# Patient Record
Sex: Female | Born: 1960 | Race: White | Hispanic: No | Marital: Single | Smoking: Current every day smoker
Health system: Southern US, Community
[De-identification: ages and names within clinical notes are randomized; demographics above are authoritative.]

## PROBLEM LIST (undated history)

## (undated) DIAGNOSIS — K219 Gastro-esophageal reflux disease without esophagitis: Secondary | ICD-10-CM

## (undated) DIAGNOSIS — E7800 Pure hypercholesterolemia, unspecified: Secondary | ICD-10-CM

## (undated) DIAGNOSIS — G629 Polyneuropathy, unspecified: Secondary | ICD-10-CM

## (undated) HISTORY — PX: NO PAST SURGERIES: SHX2092

## (undated) HISTORY — DX: Polyneuropathy, unspecified: G62.9

## (undated) HISTORY — DX: Gastro-esophageal reflux disease without esophagitis: K21.9

## (undated) HISTORY — DX: Pure hypercholesterolemia, unspecified: E78.00

## (undated) HISTORY — PX: BREAST BIOPSY: SHX20

---

## 1998-10-13 ENCOUNTER — Emergency Department (HOSPITAL_COMMUNITY): Admission: EM | Admit: 1998-10-13 | Discharge: 1998-10-13 | Payer: Self-pay | Admitting: Emergency Medicine

## 2002-03-02 ENCOUNTER — Encounter: Payer: Self-pay | Admitting: Family Medicine

## 2002-03-02 ENCOUNTER — Ambulatory Visit (HOSPITAL_COMMUNITY): Admission: RE | Admit: 2002-03-02 | Discharge: 2002-03-02 | Payer: Self-pay | Admitting: Family Medicine

## 2002-07-06 ENCOUNTER — Encounter: Admission: RE | Admit: 2002-07-06 | Discharge: 2002-07-06 | Payer: Self-pay | Admitting: Family Medicine

## 2002-07-06 ENCOUNTER — Encounter: Payer: Self-pay | Admitting: Family Medicine

## 2005-11-30 ENCOUNTER — Emergency Department (HOSPITAL_COMMUNITY): Admission: EM | Admit: 2005-11-30 | Discharge: 2005-11-30 | Payer: Self-pay | Admitting: Emergency Medicine

## 2006-01-18 ENCOUNTER — Other Ambulatory Visit: Admission: RE | Admit: 2006-01-18 | Discharge: 2006-01-18 | Payer: Self-pay | Admitting: Family Medicine

## 2007-05-27 ENCOUNTER — Ambulatory Visit (HOSPITAL_COMMUNITY): Admission: RE | Admit: 2007-05-27 | Discharge: 2007-05-27 | Payer: Self-pay | Admitting: *Deleted

## 2007-05-27 ENCOUNTER — Encounter (INDEPENDENT_AMBULATORY_CARE_PROVIDER_SITE_OTHER): Payer: Self-pay | Admitting: *Deleted

## 2009-07-08 ENCOUNTER — Other Ambulatory Visit: Admission: RE | Admit: 2009-07-08 | Discharge: 2009-07-08 | Payer: Self-pay | Admitting: Family Medicine

## 2009-07-10 ENCOUNTER — Encounter: Admission: RE | Admit: 2009-07-10 | Discharge: 2009-07-10 | Payer: Self-pay | Admitting: Family Medicine

## 2009-08-02 ENCOUNTER — Encounter: Admission: RE | Admit: 2009-08-02 | Discharge: 2009-08-02 | Payer: Self-pay | Admitting: Family Medicine

## 2009-08-08 ENCOUNTER — Encounter: Admission: RE | Admit: 2009-08-08 | Discharge: 2009-08-08 | Payer: Self-pay | Admitting: Family Medicine

## 2010-05-27 NOTE — Op Note (Signed)
NAMEPENNYE, BEEGHLY NO.:  [id number]   MEDICAL RECORD NO.:  [id number]          PATIENT TYPE:  AMB   LOCATION:  ENDO                         FACILITY:  [facility]   PHYSICIAN:  Georgiana Spinner, M.D.    DATE OF BIRTH:  [birth year]   DATE OF PROCEDURE:  DATE OF DISCHARGE:                               OPERATIVE REPORT   PROCEDURE:  Colonoscopy.   INDICATIONS:  Colonoscopy because of rectal bleeding.   ANESTHESIA:  Demerol 50, Versed 5 mg.   PROCEDURE:  With the patient mildly sedated in the left lateral  decubitus position, the Pentax videoscopic colonoscope was inserted in  the rectum and passed under direct vision to the cecum, identified by  base of cecum and ileocecal valve, both of which were photographed.  From this point, the colonoscope was slowly withdrawn taking  circumferential views of colonic mucosa, stopping only in the rectum  which appeared normal and direct showed hemorrhoids on retroflexed view.  The endoscope was straightened and withdrawn.  The patient's vital signs  and pulse oximeter remained stable.  The patient tolerated procedure  well without apparent complication.   FINDINGS:  Internal hemorrhoids, otherwise an unremarkable colonoscopic  examination.   PLAN:  See endoscopy note for further details.           ______________________________  Georgiana Spinner, M.D.     GMO/MEDQ  D:  05/27/2007  T:  05/27/2007  Job:  [id number]

## 2010-05-27 NOTE — Op Note (Signed)
Marisa Cardenas, Marisa Cardenas NO.:  [id number]   MEDICAL RECORD NO.:  [id number]          PATIENT TYPE:  AMB   LOCATION:  ENDO                         FACILITY:  [facility]   PHYSICIAN:  Georgiana Spinner, M.D.    DATE OF BIRTH:  [birth year]   DATE OF PROCEDURE:  DATE OF DISCHARGE:                               OPERATIVE REPORT   PROCEDURE:  Upper endoscopy.   INDICATIONS:  Hemoccult positivity.   ANESTHESIA:  Demerol 70 mg, Versed 7.5 mg.   PROCEDURE:  With the patient mildly sedated in the left lateral  decubitus position, the Pentax videoscopic endoscope was inserted in the  mouth, passed under direct vision through the esophagus.  We found  changes of esophagitis, photographed, entered into the stomach and there  were blood flecks seen in the stomach.  This was photographed.  As we  advanced through the antrum, duodenal bulb was inflamed.  Photographs  and biopsies taken.  Second portion duodenum appeared normal.  From this  point, the endoscope was slowly withdrawn taking circumferential views  of duodenal mucosa until the endoscope been pulled back into the  stomach, placed in retroflexion to view the stomach from below.  The  endoscope was straightened and withdrawn taking circumferential views of  the remaining gastric and esophageal mucosa.  On pullback I could not  get a good view of the areas of esophagitis so I did not biopsied them.  The endoscope was withdrawn.  The patient's vital signs and pulse  oximeter remained stable.  The patient tolerated procedure well without  apparent complication.   FINDINGS:  Blood in the stomach from esophagitis and/or duodenitis, the  latter biopsied.  An incomplete wrap of the gastroesophageal junction  around the endoscope indicating laxity of the lower esophageal  sphincter.   PLAN:  Await biopsy report.  The patient should be on a PPI or some  therapy directed toward acidity and will have the patient call me for  results of  biopsy and follow up with me as an outpatient.           ______________________________  Georgiana Spinner, M.D.     GMO/MEDQ  D:  05/27/2007  T:  05/27/2007  Job:  [id number]

## 2010-11-17 ENCOUNTER — Other Ambulatory Visit (HOSPITAL_COMMUNITY)
Admission: RE | Admit: 2010-11-17 | Discharge: 2010-11-17 | Disposition: A | Discharge status: Home / Self Care | Payer: BC Managed Care – PPO | Source: Ambulatory Visit | Attending: Family Medicine | Admitting: Family Medicine

## 2010-11-17 ENCOUNTER — Other Ambulatory Visit: Payer: Self-pay | Admitting: Family Medicine

## 2010-11-17 DIAGNOSIS — Z124 Encounter for screening for malignant neoplasm of cervix: Secondary | ICD-10-CM | POA: Insufficient documentation

## 2010-11-17 DIAGNOSIS — Z1159 Encounter for screening for other viral diseases: Secondary | ICD-10-CM | POA: Insufficient documentation

## 2013-07-11 ENCOUNTER — Other Ambulatory Visit: Payer: Self-pay | Admitting: Family Medicine

## 2013-07-11 ENCOUNTER — Ambulatory Visit
Admission: RE | Admit: 2013-07-11 | Discharge: 2013-07-11 | Disposition: A | Discharge status: Home / Self Care | Payer: BC Managed Care – PPO | Source: Ambulatory Visit | Attending: Family Medicine | Admitting: Family Medicine

## 2013-07-11 DIAGNOSIS — G629 Polyneuropathy, unspecified: Secondary | ICD-10-CM

## 2013-08-21 ENCOUNTER — Encounter: Payer: Self-pay | Admitting: Neurology

## 2013-08-21 ENCOUNTER — Ambulatory Visit (INDEPENDENT_AMBULATORY_CARE_PROVIDER_SITE_OTHER): Payer: BC Managed Care – PPO | Admitting: Neurology

## 2013-08-21 VITALS — BP 140/88 | HR 80 | Resp 16 | Ht 64.0 in | Wt 141.0 lb

## 2013-08-21 DIAGNOSIS — R209 Unspecified disturbances of skin sensation: Secondary | ICD-10-CM

## 2013-08-21 DIAGNOSIS — G5600 Carpal tunnel syndrome, unspecified upper limb: Secondary | ICD-10-CM

## 2013-08-21 DIAGNOSIS — G5602 Carpal tunnel syndrome, left upper limb: Secondary | ICD-10-CM

## 2013-08-21 NOTE — Progress Notes (Signed)
[facility] Clinic Note - Initial Visit   Date: 08/21/2013  Marisa Cardenas MRN: [id number] DOB: 1960-03-03   Dear Dr. Radene Ou:  Thank you for your kind referral of Marisa Cardenas for consultation of bilateral hand paresthesias. Although her history is well known to you, please allow [place] to reiterate it for the purpose of our medical record. The patient was accompanied to the clinic by self.    History of Present Illness: Marisa Cardenas is a 53 y.o. right-handed Caucasian female with history of GERD, hyperlipidemia, and current tobacco use presenting for evaluation of bilateral hand numbness/tingling.    Starting in ~2010, she starting having intermittent spells of numbness of the right hand, especially the 3th and 4th digit.  Symptoms started worsening in the summer of 2015 because numbness > tingling became constant.  It worse in the morning and finds herself trying to stretch her arm at night because it wakes her up from sleeping. She does report to sleeping with her wrist in a flexed position. Two weeks, ago she similar symptoms started in the left hand, but involves her entire hand.  She also has associated left arm numbness.  It stops after rubbing her hands or soaking them in hot water.   She also complains of generalized joint pain (elbows, neck) and was started on nabumetone 549m which helps, but makes her too sleepy.  She gets too sleep on amitriptyline 245m so is not taking it.  She reports having mild weakness of the arms and rarely drops things.  She feels that after taking atrovastatin in June 2015, she feels that her numbness and weakness worsened.  She endorses muscle cramps.  No myalgias or tea-colored urine.  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  Out-side paper records, electronic medical record, and images have been reviewed where available and summarized as: Labs 06/27/2013:  ESR 6, TSH  1.77, vitamin B12 398, folate 9.2, RF 9.4 XR cervical spine 07/11/2013:  There is mild degenerative disc and facet joint change of the cervical spine. There is no acute bony abnormality.   Past Medical History  Diagnosis Date  . Hypercholesteremia   . Neuropathy   . GERD (gastroesophageal reflux disease)     Past Surgical History  Procedure Laterality Date  . No past surgeries       Medications:  Lipitor 2081maily Prilosec 69m31mily Amitriptyline 25mg88mly Nabutomone 500mg 51my   Allergies:  Allergies  Allergen Reactions  . Penicillins Rash    Family History: Family History  Problem Relation Age of Onset  . COPD Mother     Living  . Lung cancer Father     Deceased  . Hypertension Brother   . Hyperlipidemia Brother   . Healthy Sister     Social History: History   Social History  . Marital Status: Single    Spouse Name: N/A    Number of Children: N/A  . Years of Education: N/A   Occupational History  . Not on file.   Social History Main Topics  . Smoking status: Current Every Day Smoker -- 1.50 packs/day for 20 years    Types: Cigarettes  . Smokeless tobacco: Not on file  . Alcohol Use: No  . Drug Use: No  . Sexual Activity: Not on file   Other Topics Concern  . Not on file   Social History Narrative   She lives with mother as her caregiver.  She has one grown child.   She works as an Transport planner.   Highest level of education:  High school    Review of Systems:  CONSTITUTIONAL: No fevers, chills, night sweats, or weight loss.   EYES: No visual changes or eye pain ENT: No hearing changes.  No history of nose bleeds.   RESPIRATORY: No cough, wheezing and shortness of breath.   CARDIOVASCULAR: Negative for chest pain, and palpitations.   GI: Negative for abdominal discomfort, blood in stools or black stools.  No recent change in bowel habits.   GU:  No history of incontinence.   MUSCLOSKELETAL: No history of joint pain or swelling.  No  myalgias.   SKIN: Negative for lesions, rash, and itching.   HEMATOLOGY/ONCOLOGY: Negative for prolonged bleeding, bruising easily, and swollen nodes.   ENDOCRINE: Negative for cold or heat intolerance, polydipsia or goiter.   PSYCH:  +depression or anxiety symptoms.   NEURO: As Above.   Vital Signs:  BP 140/88  Pulse 80  Resp 16  Ht 5' 4"  (1.626 m)  Wt 141 lb (63.957 kg)  BMI 24.19 kg/m2 Pain Scale: 4 on a scale of 0-10   General Medical Exam:   General:  Well appearing, comfortable.   Eyes/ENT: see cranial nerve examination.   Neck: No masses appreciated.  Full range of motion without tenderness.  No carotid bruits. Respiratory:  Clear to auscultation, good air entry bilaterally.   Cardiac:  Regular rate and rhythm, no murmur.   Extremities:  No deformities, edema, or skin discoloration. Good capillary refill.   Skin:  Skin color, texture, turgor normal. No rashes or lesions.  Neurological Exam: MENTAL STATUS including orientation to time, place, person, recent and remote memory, attention span and concentration, language, and fund of knowledge is normal.  Speech is not dysarthric.  CRANIAL NERVES: II:  No visual field defects.  Unremarkable fundi.   III-IV-VI: Pupils equal round and reactive to light.  Normal conjugate, extra-ocular eye movements in all directions of gaze.  No nystagmus.  No ptosis.   V:  Normal facial sensation.     VII:  Normal facial symmetry and movements.    VIII:  Normal hearing and vestibular function.   IX-X:  Normal palatal movement.   XI:  Normal shoulder shrug and head rotation.   XII:  Normal tongue strength and range of motion, no deviation or fasciculation.  MOTOR:  No atrophy, fasciculations or abnormal movements.  No pronator drift.  Tone is normal.    Right Upper Extremity:    Left Upper Extremity:    Deltoid  5/5   Deltoid  5/5   Biceps  5/5   Biceps  5/5   Triceps  5/5   Triceps  5/5   Wrist extensors  5/5   Wrist extensors  5/5     Wrist flexors  5/5   Wrist flexors  5/5   Finger extensors  5/5   Finger extensors  5/5   Finger flexors  5/5   Finger flexors  5/5   Dorsal interossei  5/5   Dorsal interossei  4+/5   Abductor pollicis  5/5   Abductor pollicis  5-/5   Tone (Ashworth scale)  0  Tone (Ashworth scale)  0   Right Lower Extremity:    Left Lower Extremity:    Hip flexors  5/5   Hip flexors  5/5   Hip extensors  5/5   Hip extensors  5/5   Knee flexors  5/5   Knee flexors  5/5   Knee extensors  5/5   Knee extensors  5/5   Dorsiflexors  5/5   Dorsiflexors  5/5   Plantarflexors  5/5   Plantarflexors  5/5   Toe extensors  5/5   Toe extensors  5/5   Toe flexors  5/5   Toe flexors  5/5   Tone (Ashworth scale)  0  Tone (Ashworth scale)  0   MSRs:  Right                                                                 Left brachioradialis 3+  brachioradialis 3+  biceps 3+  biceps 3+  triceps 3+  triceps 3+  patellar 3+  patellar 3+  ankle jerk 2+  ankle jerk 2+  Hoffman no  Hoffman no  plantar response down  plantar response down   SENSORY:  Normal and symmetric perception of light touch, pinprick, vibration, and proprioception.  Romberg's sign absent.   COORDINATION/GAIT: Normal finger-to- nose-finger and heel-to-shin.  Intact rapid alternating movements bilaterally.  Able to rise from a chair without using arms.  Gait narrow based and stable. Tandem and stressed gait intact.   Other:  Tinel's sign positive over the wrist and medial elbow on the left   IMPRESSION/PLAN: Marisa Cardenas is a 53 year-old female presenting for evaluation of bilateral hand paresthesias. Her neurological examination shows mild weakness of the intrinsic hand muscles on the left side, with positive Tinel's over the median and ulnar nerves on the same side. She has relatively brisk reflexes throughout, which in the absence of other associated upper motor neuron findings may be in normal for patient, however cannot exclude possible  cervical canal stenosis. Considerations include left carpal tunnel syndrome, left ulnar neuropathy, and/or right C7 cervical radiculopathy Symptoms are not consistent with statin induced myopathy/myalgias.   To better characterize the nature of her symptoms, I would like to obtain an EMG of bilateral upper extremities.  Additionally, due to high clinical suspicion for carpal tunnel syndrome on the left side, I have asked her to start using a wrist splint.   Discussed adding gabapentin for paresthesias, but she has predominantly numbness with minimal tingling, in which case it would not be helpful.  Return to clinic in 73-month.   The duration of this appointment visit was 45 minutes of face-to-face time with the patient.  Greater than 50% of this time was spent in counseling, explanation of diagnosis, planning of further management, and coordination of care.   Thank you for allowing me to participate in patient's care.  If I can answer any additional questions, I would be pleased to do so.    Sincerely,    Donika K. PPosey Pronto DO

## 2013-08-21 NOTE — Patient Instructions (Signed)
1.  Start using wrist splint on the left hand at bedtime 2.  NCS/EMG of the upper extremities 3.  Return to clinic in 2 months

## 2013-10-23 ENCOUNTER — Ambulatory Visit: Payer: Self-pay

## 2013-10-23 ENCOUNTER — Other Ambulatory Visit: Payer: Self-pay | Admitting: Occupational Medicine

## 2013-10-23 DIAGNOSIS — M25531 Pain in right wrist: Secondary | ICD-10-CM

## 2013-11-01 ENCOUNTER — Ambulatory Visit (INDEPENDENT_AMBULATORY_CARE_PROVIDER_SITE_OTHER): Payer: BC Managed Care – PPO | Admitting: Neurology

## 2013-11-01 DIAGNOSIS — R209 Unspecified disturbances of skin sensation: Secondary | ICD-10-CM

## 2013-11-01 DIAGNOSIS — G5602 Carpal tunnel syndrome, left upper limb: Secondary | ICD-10-CM

## 2013-11-01 NOTE — Procedures (Signed)
[facility]  [address], [address]  [place], [place] [address] Tel: [phone] Fax:  [phone] Test Date:  11/01/2013  Patient: Marisa LowensteinDeborah Capraro DOB: 1960/02/10 Physician: Nita Sickleonika Kalev Temme  Sex: Female Height: 5\' 4"  Ref Phys: Nita Sickleatel, Eugenie Harewood  ID#: [id number] Temp: 37.0C Technician: Ala BentSusan Reid   Patient Complaints: Patient is a 53 year old female presenting for calculation of bilateral hand paresthesias.   NCV & EMG Findings: Extensive electrodiagnostic testing of the left upper extremity and additional studies of the right reveals:  1. Bilateral median sensory responses are prolonged and amplitude is reduced. Bilateral ulnar and radial sensory responses are within normal limits. 2. Bilateral median motor responses are prolonged with preserved amplitude. Bilateral ulnar motor responses are normal. 3. There is no evidence of active or chronic motor axon loss changes affecting any of the tested muscles. Motor unit configuration and recruitment pattern is normal.  Impression: 1. Bilateral median neuropathy at or distal to the wrist, consistent with the clinical diagnosis of carpal tunnel syndrome. Overall, these findings are moderate-to-severe in degree electrically. 2. There is no evidence of a cervical radiculopathy affecting upper extremities.   ___________________________ Nita Sickleonika Anadia Helmes    Nerve Conduction Studies Anti Sensory Summary Table   Stim Site NR Peak (ms) Norm Peak (ms) P-T Amp (V) Norm P-T Amp  Left Median Anti Sensory (2nd Digit)  37C  Wrist    4.9 <3.6 11.3 >15  Right Median Anti Sensory (2nd Digit)  34C  Wrist    5.3 <3.6 9.7 >15  Left Radial Anti Sensory (Base 1st Digit)  34C  Wrist    2.2 <2.7 25.5 >14  Right Radial Anti Sensory (Base 1st Digit)  34C  Wrist    2.1 <2.7 22.8 >14  Left Ulnar Anti Sensory (5th Digit)  37C  Wrist    2.5 <3.1 17.0 >10  Right Ulnar Anti Sensory (5th Digit)  34C  Wrist    2.5 <3.1 21.5 >10   Motor Summary  Table   Stim Site NR Onset (ms) Norm Onset (ms) O-P Amp (mV) Norm O-P Amp Site1 Site2 Delta-0 (ms) Dist (cm) Vel (m/s) Norm Vel (m/s)  Left Median Motor (Abd Poll Brev)  37C  Wrist    4.2 <4.0 10.2 >6 Elbow Wrist 4.7 26.0 55 >50  Elbow    8.9  9.9         Right Median Motor (Abd Poll Brev)  34C  Wrist    4.9 <4.0 9.0 >6 Elbow Wrist 4.8 26.0 54 >50  Elbow    9.7  8.5         Left Ulnar Motor (Abd Dig Minimi)  34C  Wrist    1.9 <3.1 8.0 >7 B Elbow Wrist 3.3 20.0 61 >50  B Elbow    5.2  7.8  A Elbow B Elbow 1.8 10.0 56 >50  A Elbow    7.0  7.6         Right Ulnar Motor (Abd Dig Minimi)  34C  Wrist    2.0 <3.1 7.2 >7 B Elbow Wrist 3.3 20.0 61 >50  B Elbow    5.3  7.1  A Elbow B Elbow 1.7 10.0 59 >50  A Elbow    7.0  7.0          F Wave Studies   NR F-Lat (ms) Lat Norm (ms) L-R F-Lat (ms)  Left Ulnar (Mrkrs) (Abd Dig Min)  37C     25.82 <33  EMG   Side Muscle Ins Act Fibs Psw Fasc Number Recrt Dur Dur. Amp Amp. Poly Poly. Comment  Right 1stDorInt Nml Nml Nml Nml Nml Nml Nml Nml Nml Nml Nml Nml N/A  Right Abd Poll Brev Nml Nml Nml Nml Nml Nml Nml Nml Nml Nml Nml Nml N/A  Right FlexPolLong Nml Nml Nml Nml Nml Nml Nml Nml Nml Nml Nml Nml N/A  Right PronatorTeres Nml Nml Nml Nml Nml Nml Nml Nml Nml Nml Nml Nml N/A  Right Biceps Nml Nml Nml Nml Nml Nml Nml Nml Nml Nml Nml Nml N/A  Right Triceps Nml Nml Nml Nml Nml Nml Nml Nml Nml Nml Nml Nml N/A  Right Deltoid Nml Nml Nml Nml Nml Nml Nml Nml Nml Nml Nml Nml N/A  Left 1stDorInt Nml Nml Nml Nml Nml Nml Nml Nml Nml Nml Nml Nml N/A  Left Abd Poll Brev Nml Nml Nml Nml Nml Nml Nml Nml Nml Nml Nml Nml N/A  Left FlexPolLong Nml Nml Nml Nml Nml Nml Nml Nml Nml Nml Nml Nml N/A  Left PronatorTeres Nml Nml Nml Nml Nml Nml Nml Nml Nml Nml Nml Nml N/A  Left Triceps Nml Nml Nml Nml Nml Nml Nml Nml Nml Nml Nml Nml N/A      Waveforms:

## 2013-12-25 ENCOUNTER — Ambulatory Visit: Payer: BC Managed Care – PPO | Admitting: Physician Assistant

## 2013-12-25 ENCOUNTER — Other Ambulatory Visit: Payer: Self-pay | Admitting: Orthopedic Surgery

## 2013-12-26 ENCOUNTER — Ambulatory Visit: Payer: BC Managed Care – PPO | Admitting: Physician Assistant

## 2014-01-01 ENCOUNTER — Encounter (HOSPITAL_BASED_OUTPATIENT_CLINIC_OR_DEPARTMENT_OTHER): Admission: RE | Payer: Self-pay | Source: Ambulatory Visit

## 2014-01-01 ENCOUNTER — Ambulatory Visit (HOSPITAL_BASED_OUTPATIENT_CLINIC_OR_DEPARTMENT_OTHER)
Admission: RE | Admit: 2014-01-01 | Payer: BC Managed Care – PPO | Source: Ambulatory Visit | Admitting: Orthopedic Surgery

## 2014-01-01 SURGERY — RELEASE, CARPAL TUNNEL, ENDOSCOPIC
Anesthesia: Monitor Anesthesia Care | Laterality: Right

## 2014-01-22 ENCOUNTER — Ambulatory Visit (HOSPITAL_BASED_OUTPATIENT_CLINIC_OR_DEPARTMENT_OTHER)
Admission: RE | Admit: 2014-01-22 | Payer: BC Managed Care – PPO | Source: Ambulatory Visit | Admitting: Orthopedic Surgery

## 2014-01-22 ENCOUNTER — Encounter (HOSPITAL_BASED_OUTPATIENT_CLINIC_OR_DEPARTMENT_OTHER): Admission: RE | Payer: Self-pay | Source: Ambulatory Visit

## 2014-01-22 SURGERY — RELEASE, CARPAL TUNNEL, ENDOSCOPIC
Anesthesia: Monitor Anesthesia Care | Site: Wrist | Laterality: Right

## 2014-07-25 ENCOUNTER — Encounter (HOSPITAL_COMMUNITY): Payer: Self-pay | Admitting: Emergency Medicine

## 2014-07-25 ENCOUNTER — Emergency Department (HOSPITAL_COMMUNITY): Payer: BLUE CROSS/BLUE SHIELD

## 2014-07-25 ENCOUNTER — Emergency Department (HOSPITAL_COMMUNITY)
Admission: EM | Admit: 2014-07-25 | Discharge: 2014-07-25 | Disposition: A | Discharge status: Home / Self Care | Payer: BLUE CROSS/BLUE SHIELD | Attending: Emergency Medicine | Admitting: Emergency Medicine

## 2014-07-25 DIAGNOSIS — E782 Mixed hyperlipidemia: Secondary | ICD-10-CM | POA: Diagnosis not present

## 2014-07-25 DIAGNOSIS — M25512 Pain in left shoulder: Secondary | ICD-10-CM

## 2014-07-25 DIAGNOSIS — S79912A Unspecified injury of left hip, initial encounter: Secondary | ICD-10-CM | POA: Insufficient documentation

## 2014-07-25 DIAGNOSIS — W1782XA Fall from (out of) grocery cart, initial encounter: Secondary | ICD-10-CM | POA: Insufficient documentation

## 2014-07-25 DIAGNOSIS — Z8719 Personal history of other diseases of the digestive system: Secondary | ICD-10-CM | POA: Insufficient documentation

## 2014-07-25 DIAGNOSIS — Z880 Allergy status to penicillin: Secondary | ICD-10-CM | POA: Diagnosis not present

## 2014-07-25 DIAGNOSIS — M545 Low back pain, unspecified: Secondary | ICD-10-CM

## 2014-07-25 DIAGNOSIS — S3992XA Unspecified injury of lower back, initial encounter: Secondary | ICD-10-CM | POA: Diagnosis not present

## 2014-07-25 DIAGNOSIS — Z8669 Personal history of other diseases of the nervous system and sense organs: Secondary | ICD-10-CM | POA: Diagnosis not present

## 2014-07-25 DIAGNOSIS — Z720 Tobacco use: Secondary | ICD-10-CM | POA: Diagnosis not present

## 2014-07-25 DIAGNOSIS — S4992XA Unspecified injury of left shoulder and upper arm, initial encounter: Secondary | ICD-10-CM | POA: Insufficient documentation

## 2014-07-25 DIAGNOSIS — Z79899 Other long term (current) drug therapy: Secondary | ICD-10-CM | POA: Insufficient documentation

## 2014-07-25 DIAGNOSIS — Y9389 Activity, other specified: Secondary | ICD-10-CM | POA: Insufficient documentation

## 2014-07-25 DIAGNOSIS — Y92512 Supermarket, store or market as the place of occurrence of the external cause: Secondary | ICD-10-CM | POA: Insufficient documentation

## 2014-07-25 DIAGNOSIS — M25552 Pain in left hip: Secondary | ICD-10-CM

## 2014-07-25 DIAGNOSIS — Y998 Other external cause status: Secondary | ICD-10-CM | POA: Insufficient documentation

## 2014-07-25 MED ORDER — HYDROCODONE-ACETAMINOPHEN 5-325 MG PO TABS
2.0000 | ORAL_TABLET | Freq: Once | ORAL | Status: AC
  Administered 2014-07-25: 2 via ORAL
  Filled 2014-07-25: qty 2

## 2014-07-25 MED ORDER — HYDROCODONE-ACETAMINOPHEN 5-325 MG PO TABS: 1.0000 | ORAL_TABLET | ORAL | Status: DC | PRN

## 2014-07-25 MED ORDER — NAPROXEN 500 MG PO TABS: 500.0000 mg | ORAL_TABLET | Freq: Two times a day (BID) | ORAL | Status: DC

## 2014-07-25 MED ORDER — METHOCARBAMOL 500 MG PO TABS: 500.0000 mg | ORAL_TABLET | Freq: Two times a day (BID) | ORAL | Status: DC

## 2014-07-25 MED ORDER — METHOCARBAMOL 500 MG PO TABS
500.0000 mg | ORAL_TABLET | Freq: Once | ORAL | Status: AC
  Administered 2014-07-25: 500 mg via ORAL
  Filled 2014-07-25: qty 1

## 2014-07-25 NOTE — ED Notes (Signed)
Pt states she slipped in a grocery store and fell around 11:30am.  C/o pain to L hip/buttocks, lower back, and L shoulder.  Denies LOC.  Denies neck pain.

## 2014-07-25 NOTE — ED Notes (Signed)
Patient transported to X-ray 

## 2014-07-25 NOTE — Discharge Instructions (Signed)
1. Medications: robaxin, naproxyn, vicodin, usual home medications 2. Treatment: rest, drink plenty of fluids, gentle stretching as discussed, alternate ice and heat 3. Follow Up: Please followup with your primary doctor in 3 days for discussion of your diagnoses and further evaluation after today's visit; if you do not have a primary care doctor use the resource guide provided to find one;  Return to the ER for worsening back pain, difficulty walking, loss of bowel or bladder control or other concerning symptoms  Arthralgia Your caregiver has diagnosed you as suffering from an arthralgia. Arthralgia means there is pain in a joint. This can come from many reasons including:  Bruising the joint which causes soreness (inflammation) in the joint.  Wear and tear on the joints which occur as we grow older (osteoarthritis).  Overusing the joint.  Various forms of arthritis.  Infections of the joint. Regardless of the cause of pain in your joint, most of these different pains respond to anti-inflammatory drugs and rest. The exception to this is when a joint is infected, and these cases are treated with antibiotics, if it is a bacterial infection. HOME CARE INSTRUCTIONS   Rest the injured area for as long as directed by your caregiver. Then slowly start using the joint as directed by your caregiver and as the pain allows. Crutches as directed may be useful if the ankles, knees or hips are involved. If the knee was splinted or casted, continue use and care as directed. If an stretchy or elastic wrapping bandage has been applied today, it should be removed and re-applied every 3 to 4 hours. It should not be applied tightly, but firmly enough to keep swelling down. Watch toes and feet for swelling, bluish discoloration, coldness, numbness or excessive pain. If any of these problems (symptoms) occur, remove the ace bandage and re-apply more loosely. If these symptoms persist, contact your caregiver or return  to this location.  For the first 24 hours, keep the injured extremity elevated on pillows while lying down.  Apply ice for 15-20 minutes to the sore joint every couple hours while awake for the first half day. Then 03-04 times per day for the first 48 hours. Put the ice in a plastic bag and place a towel between the bag of ice and your skin.  Wear any splinting, casting, elastic bandage applications, or slings as instructed.  Only take over-the-counter or prescription medicines for pain, discomfort, or fever as directed by your caregiver. Do not use aspirin immediately after the injury unless instructed by your physician. Aspirin can cause increased bleeding and bruising of the tissues.  If you were given crutches, continue to use them as instructed and do not resume weight bearing on the sore joint until instructed. Persistent pain and inability to use the sore joint as directed for more than 2 to 3 days are warning signs indicating that you should see a caregiver for a follow-up visit as soon as possible. Initially, a hairline fracture (break in bone) may not be evident on X-rays. Persistent pain and swelling indicate that further evaluation, non-weight bearing or use of the joint (use of crutches or slings as instructed), or further X-rays are indicated. X-rays may sometimes not show a small fracture until a week or 10 days later. Make a follow-up appointment with your own caregiver or one to whom we have referred you. A radiologist (specialist in reading X-rays) may read your X-rays. Make sure you know how you are to obtain your X-ray results. Do  not assume everything is normal if you do not hear from [place]. SEEK MEDICAL CARE IF: Bruising, swelling, or pain increases. SEEK IMMEDIATE MEDICAL CARE IF:   Your fingers or toes are numb or blue.  The pain is not responding to medications and continues to stay the same or get worse.  The pain in your joint becomes severe.  You develop a fever over  102 F (38.9 C).  It becomes impossible to move or use the joint. MAKE SURE YOU:   Understand these instructions.  Will watch your condition.  Will get help right away if you are not doing well or get worse. Document Released: 12/29/2004 Document Revised: 03/23/2011 Document Reviewed: 08/17/2007 [facility] Patient Information 2015 [place], [place]. This information is not intended to replace advice given to you by your health care provider. Make sure you discuss any questions you have with your health care provider.   Cryotherapy Cryotherapy means treatment with cold. Ice or gel packs can be used to reduce both pain and swelling. Ice is the most helpful within the first 24 to 48 hours after an injury or flare-up from overusing a muscle or joint. Sprains, strains, spasms, burning pain, shooting pain, and aches can all be eased with ice. Ice can also be used when recovering from surgery. Ice is effective, has very few side effects, and is safe for most people to use. PRECAUTIONS  Ice is not a safe treatment option for people with:  Raynaud phenomenon. This is a condition affecting small blood vessels in the extremities. Exposure to cold may cause your problems to return.  Cold hypersensitivity. There are many forms of cold hypersensitivity, including:  Cold urticaria. Red, itchy hives appear on the skin when the tissues begin to warm after being iced.  Cold erythema. This is a red, itchy rash caused by exposure to cold.  Cold hemoglobinuria. Red blood cells break down when the tissues begin to warm after being iced. The hemoglobin that carry oxygen are passed into the urine because they cannot combine with blood proteins fast enough.  Numbness or altered sensitivity in the area being iced. If you have any of the following conditions, do not use ice until you have discussed cryotherapy with your caregiver:  Heart conditions, such as arrhythmia, angina, or chronic heart disease.  High  blood pressure.  Healing wounds or open skin in the area being iced.  Current infections.  Rheumatoid arthritis.  Poor circulation.  Diabetes. Ice slows the blood flow in the region it is applied. This is beneficial when trying to stop inflamed tissues from spreading irritating chemicals to surrounding tissues. However, if you expose your skin to cold temperatures for too long or without the proper protection, you can damage your skin or nerves. Watch for signs of skin damage due to cold. HOME CARE INSTRUCTIONS Follow these tips to use ice and cold packs safely.  Place a dry or damp towel between the ice and skin. A damp towel will cool the skin more quickly, so you may need to shorten the time that the ice is used.  For a more rapid response, add gentle compression to the ice.  Ice for no more than 10 to 20 minutes at a time. The bonier the area you are icing, the less time it will take to get the benefits of ice.  Check your skin after 5 minutes to make sure there are no signs of a poor response to cold or skin damage.  Rest 20 minutes or  more between uses.  Once your skin is numb, you can end your treatment. You can test numbness by very lightly touching your skin. The touch should be so light that you do not see the skin dimple from the pressure of your fingertip. When using ice, most people will feel these normal sensations in this order: cold, burning, aching, and numbness.  Do not use ice on someone who cannot communicate their responses to pain, such as small children or people with dementia. HOW TO MAKE AN ICE PACK Ice packs are the most common way to use ice therapy. Other methods include ice massage, ice baths, and cryosprays. Muscle creams that cause a cold, tingly feeling do not offer the same benefits that ice offers and should not be used as a substitute unless recommended by your caregiver. To make an ice pack, do one of the following:  Place crushed ice or a bag of  frozen vegetables in a sealable plastic bag. Squeeze out the excess air. Place this bag inside another plastic bag. Slide the bag into a pillowcase or place a damp towel between your skin and the bag.  Mix 3 parts water with 1 part rubbing alcohol. Freeze the mixture in a sealable plastic bag. When you remove the mixture from the freezer, it will be slushy. Squeeze out the excess air. Place this bag inside another plastic bag. Slide the bag into a pillowcase or place a damp towel between your skin and the bag. SEEK MEDICAL CARE IF:  You develop white spots on your skin. This may give the skin a blotchy (mottled) appearance.  Your skin turns blue or pale.  Your skin becomes waxy or hard.  Your swelling gets worse. MAKE SURE YOU:   Understand these instructions.  Will watch your condition.  Will get help right away if you are not doing well or get worse. Document Released: 08/25/2010 Document Revised: 05/15/2013 Document Reviewed: 08/25/2010 [facility] Patient Information 2015 [place], [place]. This information is not intended to replace advice given to you by your health care provider. Make sure you discuss any questions you have with your health care provider.

## 2014-07-25 NOTE — ED Provider Notes (Signed)
CSN: [id number]     Arrival date & time 07/25/14  1542 History   This chart was scribed for non-physician practitioner, Dierdre ForthHannah Hong Moring PA-C working with Bethann BerkshireJoseph Zammit, MD by Arlan OrganAshley Leger, ED Scribe. This patient was seen in room TR10C/TR10C and the patient's care was started at 6:31 PM.   Chief Complaint  Patient presents with  . Fall  . Hip Pain  . Shoulder Pain  . Back Pain   The history is provided by the patient and medical records. No language interpreter was used.    HPI Comments: Marisa Cardenas is a 54 y.o. female without any pertinent past medical history who presents to the Emergency Department here after a fall sustained at 11:30 AM this morning. Pt states she slipped while at the grocery store landing on her L shoulder. She now c/o constant, ongoing, unchanged L hip/buttock pain, lower back pain, and pain to the L shoulder. No head trauma or LOC at time of fall. OTC Advil PM attempted at home without any improvement for symptoms. No recent fever or chills. No numbness, loss of sensation, or paresthesia to lower extremities. No bowel or urinary incontinence. Pt with known allergy to Penicillins.  Past Medical History  Diagnosis Date  . Hypercholesteremia   . Neuropathy   . GERD (gastroesophageal reflux disease)    Past Surgical History  Procedure Laterality Date  . No past surgeries     Family History  Problem Relation Age of Onset  . COPD Mother     Living  . Lung cancer Father     Deceased  . Hypertension Brother   . Hyperlipidemia Brother   . Healthy Sister    History  Substance Use Topics  . Smoking status: Current Every Day Smoker -- 1.50 packs/day for 20 years    Types: Cigarettes  . Smokeless tobacco: Not on file  . Alcohol Use: No   OB History    No data available     Review of Systems  Constitutional: Negative for fever, chills and fatigue.  Respiratory: Negative for chest tightness and shortness of breath.   Cardiovascular: Negative for  chest pain.  Gastrointestinal: Negative for nausea, vomiting, abdominal pain and diarrhea.  Genitourinary: Negative for dysuria, urgency, frequency and hematuria.  Musculoskeletal: Positive for back pain, arthralgias and gait problem ( 2/2 pain ). Negative for joint swelling, neck pain and neck stiffness.  Skin: Negative for rash.  Neurological: Negative for weakness, light-headedness, numbness and headaches.  Psychiatric/Behavioral: Negative for confusion.  All other systems reviewed and are negative.     Allergies  Penicillins  Home Medications   Prior to Admission medications   Medication Sig Start Date End Date Taking? Authorizing Provider  amitriptyline (ELAVIL) 25 MG tablet Take 25 mg by mouth at bedtime.  07/05/13   Historical Provider, MD  atorvastatin (LIPITOR) 20 MG tablet Take 20 mg by mouth daily at 6 PM.  07/05/13   Historical Provider, MD  HYDROcodone-acetaminophen (NORCO/VICODIN) 5-325 MG per tablet Take 1-2 tablets by mouth every 4 (four) hours as needed. 07/25/14   Talli Kimmer, PA-C  methocarbamol (ROBAXIN) 500 MG tablet Take 1 tablet (500 mg total) by mouth 2 (two) times daily. 07/25/14   Vonzella Althaus, PA-C  nabumetone (RELAFEN) 500 MG tablet Take 500 mg by mouth daily.  06/26/13   Historical Provider, MD  naproxen (NAPROSYN) 500 MG tablet Take 1 tablet (500 mg total) by mouth 2 (two) times daily with a meal. 07/25/14   Dierdre ForthHannah Anet Logsdon, PA-C  Triage Vitals: BP 106/79 mmHg  Pulse 88  Temp(Src) 98.2 F (36.8 C) (Oral)  Resp 18  Ht 5\' 4"  (1.626 m)  Wt 138 lb (62.596 kg)  BMI 23.68 kg/m2  SpO2 95%    Physical Exam  Constitutional: She appears well-developed and well-nourished. No distress.  HENT:  Head: Normocephalic and atraumatic.  Mouth/Throat: Oropharynx is clear and moist. No oropharyngeal exudate.  Eyes: Conjunctivae are normal.  Neck: Normal range of motion. Neck supple.  Full ROM without pain  Cardiovascular: Normal rate, regular rhythm  and intact distal pulses.   Pulmonary/Chest: Effort normal and breath sounds normal. No respiratory distress. She has no wheezes.  Abdominal: Soft. She exhibits no distension. There is no tenderness.  Musculoskeletal: She exhibits tenderness.  Full range of motion of the T-spine and L-spine No tenderness to palpation of the spinous processes of the T-spine or L-spine Mild tenderness to palpation of the L paraspinous muscles of the L-spine Tenderness to palpation of the L SI joint  FROM of L shoulder with pain  Mild anterior and posterior joint line tenderness of the L shoulder without swelling, deformity, or ecchymosis  FROM of the L hip with mild pain  Tenderness to palpation of the greater trochanter of the L hip  Lymphadenopathy:    She has no cervical adenopathy.  Neurological: She is alert. She has normal reflexes.  Reflex Scores:      Bicep reflexes are 2+ on the right side and 2+ on the left side.      Brachioradialis reflexes are 2+ on the right side and 2+ on the left side.      Patellar reflexes are 2+ on the right side and 2+ on the left side.      Achilles reflexes are 2+ on the right side and 2+ on the left side. Speech is clear and goal oriented, follows commands Normal 5/5 strength in upper and lower extremities bilaterally including dorsiflexion and plantar flexion, strong and equal grip strength, flexion, extension, abduction, and adduction of L hip and L shoulder  Sensation normal to light and sharp touch Moves extremities without ataxia, coordination intact Normal gait Normal balance No Clonus   Skin: Skin is warm and dry. No rash noted. She is not diaphoretic. No erythema.  Psychiatric: She has a normal mood and affect. Her behavior is normal.  Nursing note and vitals reviewed.   ED Course  Procedures (including critical care time)  DIAGNOSTIC STUDIES: Oxygen Saturation is 94% on RA, adequate by my interpretation.    COORDINATION OF CARE: 6:40 PM- Will  order DG shoulder L, DG lunbar spine complete, and DG hip unilat with pelvis 2-3 views L. Will give Norco and Robaxin. Discussed treatment plan with pt at bedside and pt agreed to plan.     Labs Review Labs Reviewed - No data to display  Imaging Review Dg Lumbar Spine Complete  07/25/2014   CLINICAL DATA:  Status post fall, left-sided pain  EXAM: LUMBAR SPINE - COMPLETE 4+ VIEW  COMPARISON:  None.  FINDINGS: There are 5 nonrib bearing lumbar-type vertebral bodies.  The vertebral body heights are maintained.  The alignment is anatomic. There is no spondylolysis. There is no static listhesis.  There is no acute fracture.  The disc spaces are maintained.  There is degenerative disc disease at T11-12. The remainder the disc spaces are relatively well maintained.  There is abdominal aortic atherosclerosis.  IMPRESSION: No acute osseous injury of the lumbar spine.   Electronically Signed  By: Elige Ko   On: 07/25/2014 18:13   Dg Shoulder Left  07/25/2014   CLINICAL DATA:  Status post fall.  EXAM: LEFT SHOULDER - 2+ VIEW  COMPARISON:  None.  FINDINGS: There is no evidence of fracture or dislocation. There is no evidence of arthropathy or other focal bone abnormality. Soft tissues are unremarkable.  IMPRESSION: No acute osseous injury of the left shoulder.   Electronically Signed   By: Elige Ko   On: 07/25/2014 18:08   Dg Hip Unilat With Pelvis 2-3 Views Left  07/25/2014   CLINICAL DATA:  Fall and landed on left hip. Left hip pain. Initial encounter.  EXAM: DG HIP (WITH OR WITHOUT PELVIS) 2-3V LEFT  COMPARISON:  None.  FINDINGS: There is no evidence of hip fracture or dislocation. There is no evidence of arthropathy or other focal bone abnormality.  IMPRESSION: Negative.   Electronically Signed   By: Myles Rosenthal M.D.   On: 07/25/2014 18:19     EKG Interpretation None      MDM   Final diagnoses:  Left shoulder pain  Left hip pain  Left-sided low back pain without sciatica   Bentley Haralson presents after fall this a.m. Patient ambulatory without difficulty.  Patient X-Ray negative for obvious fracture or dislocation. Range of motion of all painful joints. Pain managed in ED. Pt advised to follow up with [facility] care if symptoms persist. Patient given ice and pain control while in ED, conservative therapy recommended and discussed. Patient will be dc home & is agreeable with above plan.  BP 106/79 mmHg  Pulse 88  Temp(Src) 98.2 F (36.8 C) (Oral)  Resp 18  Ht  (1.626 m)  Wt 138 lb (62.596 kg)  BMI 23.68 kg/m2  SpO2 95%  I personally performed the services described in this documentation, which was scribed in my presence. The recorded information has been reviewed and is accurate.   Dahlia Client Zoe Nordin, PA-C 07/25/14 1850  Bethann Berkshire, MD 07/25/14 2219

## 2016-06-17 ENCOUNTER — Other Ambulatory Visit: Payer: Self-pay | Admitting: Family

## 2016-06-17 DIAGNOSIS — N6489 Other specified disorders of breast: Secondary | ICD-10-CM

## 2016-07-02 IMAGING — DX DG HIP (WITH OR WITHOUT PELVIS) 2-3V*L*
2 series · 2 of 2 positions shown · non-contrast
Comparison: None.

CLINICAL DATA: Fall and landed on left hip. Left hip pain. Initial
encounter.

EXAM:
DG HIP (WITH OR WITHOUT PELVIS) 2-3V LEFT

[w hip lat left (1 of 2)]
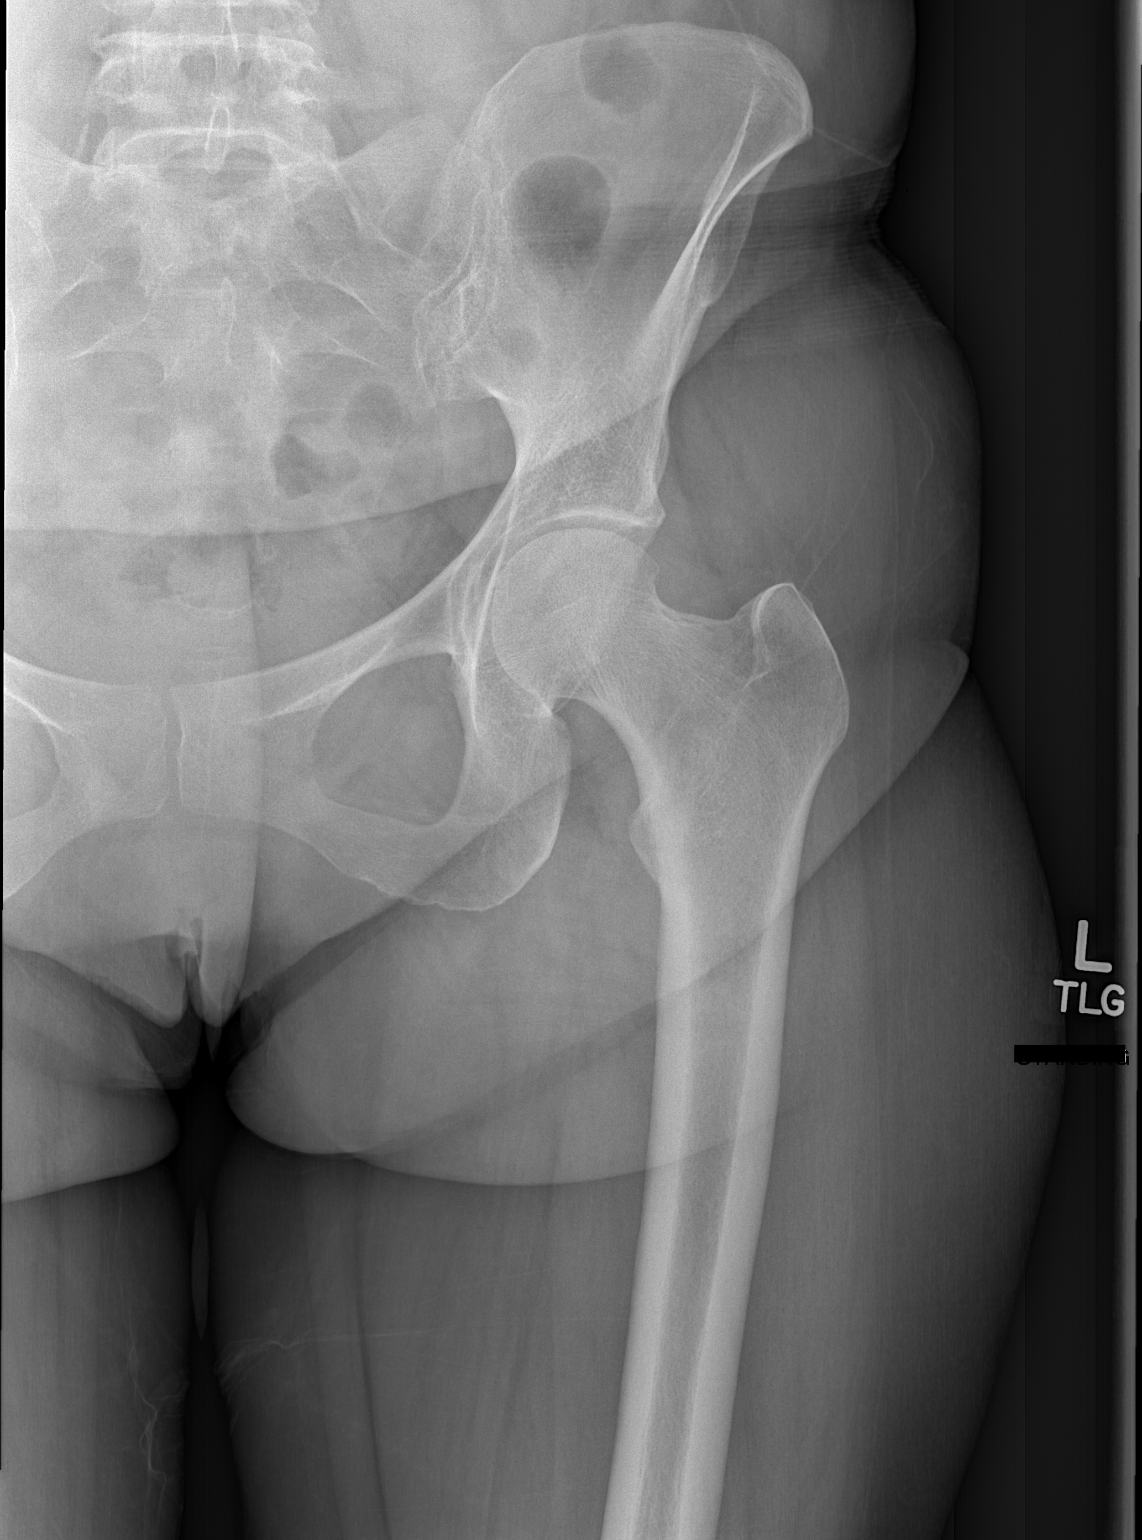

[w hip lat left (2 of 2)]
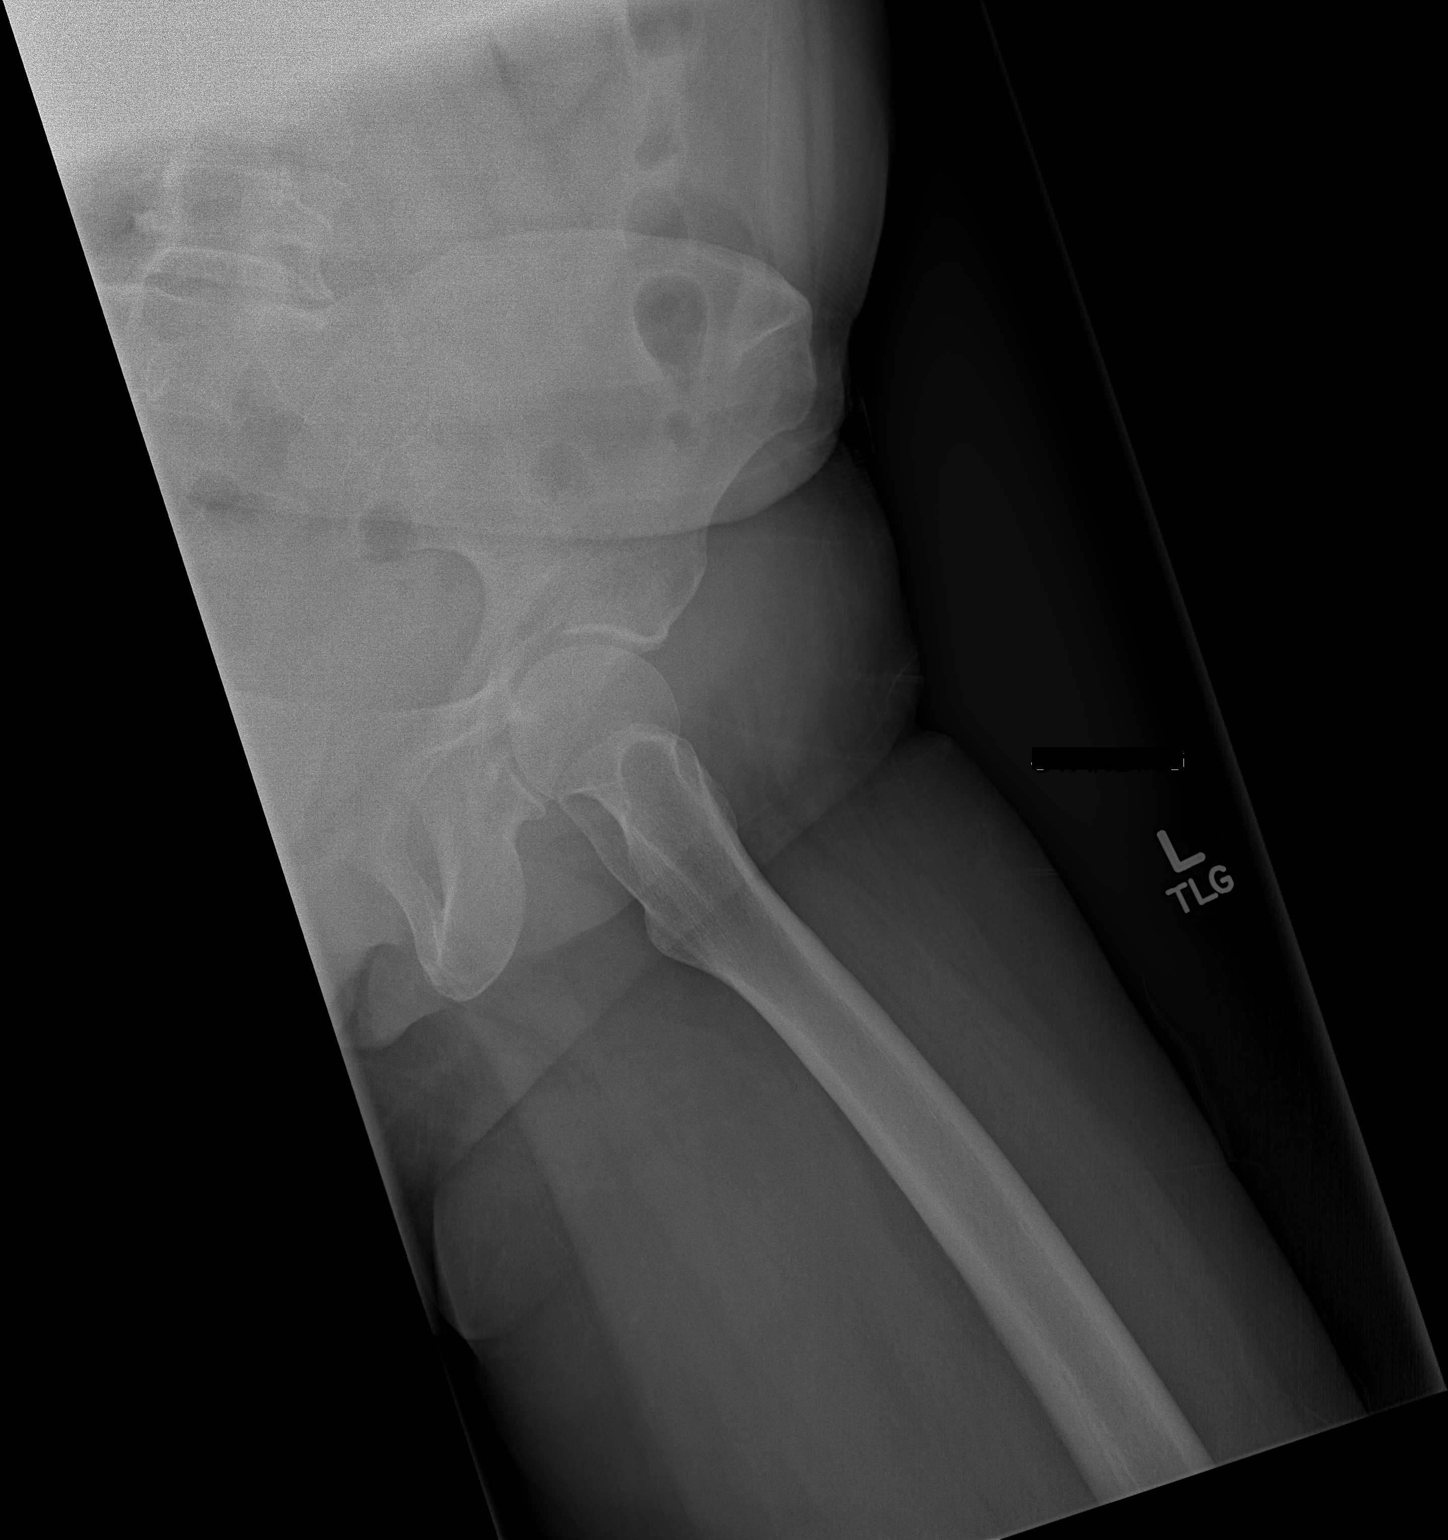

[2 of 2 positions shown; findings below may reference images not displayed]

FINDINGS: There is no evidence of hip fracture or dislocation. There is no
evidence of arthropathy or other focal bone abnormality.
IMPRESSION: Negative.

## 2016-07-02 IMAGING — DX DG LUMBAR SPINE COMPLETE 4+V
5 series · 5 of 5 positions shown · non-contrast
Comparison: None.

CLINICAL DATA: Status post fall, left-sided pain

EXAM:
LUMBAR SPINE - COMPLETE 4+ VIEW

[t lumbar spine ap]
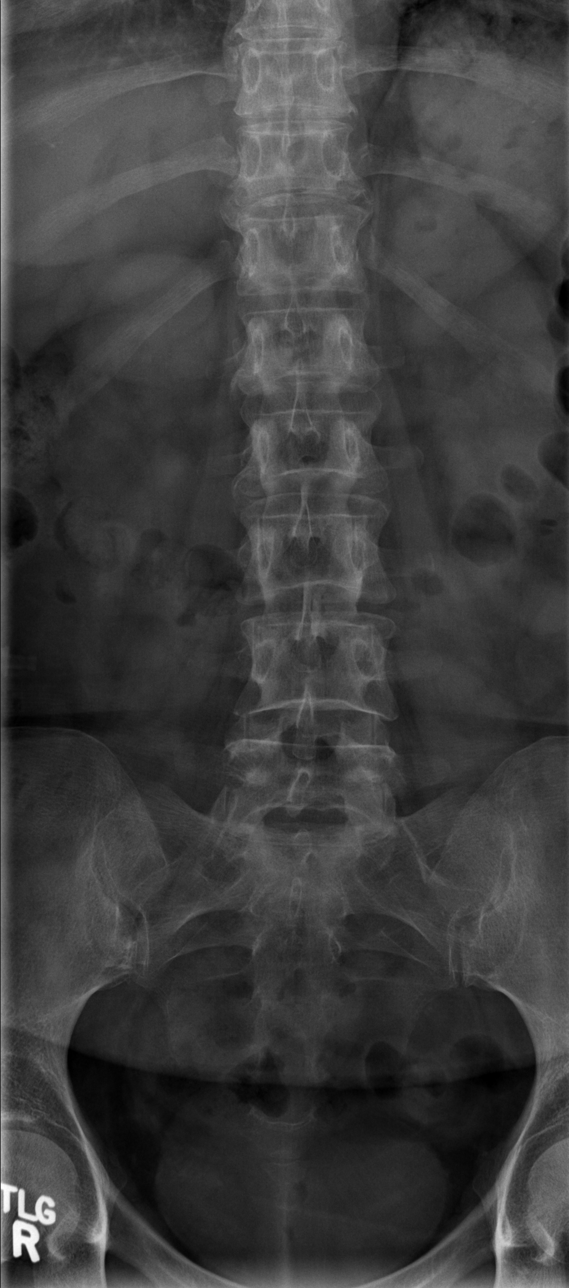

[t lumbar spine obl (1 of 2)]
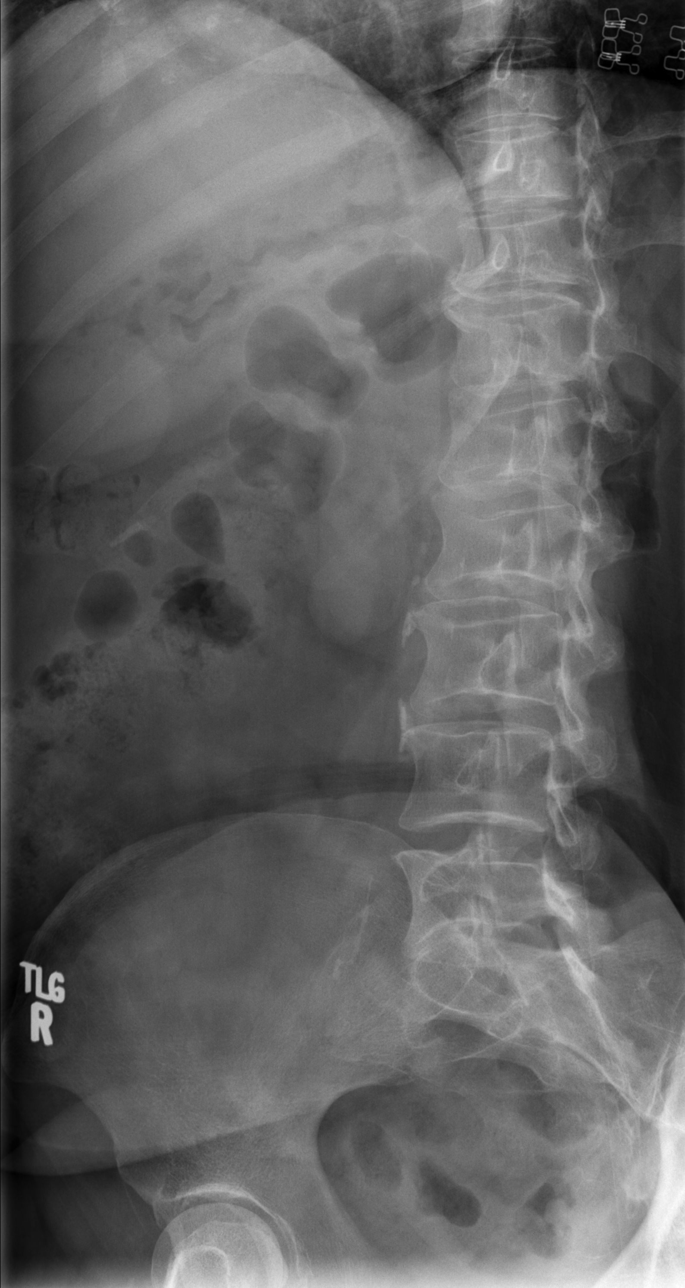

[t lumbar spine obl (2 of 2)]
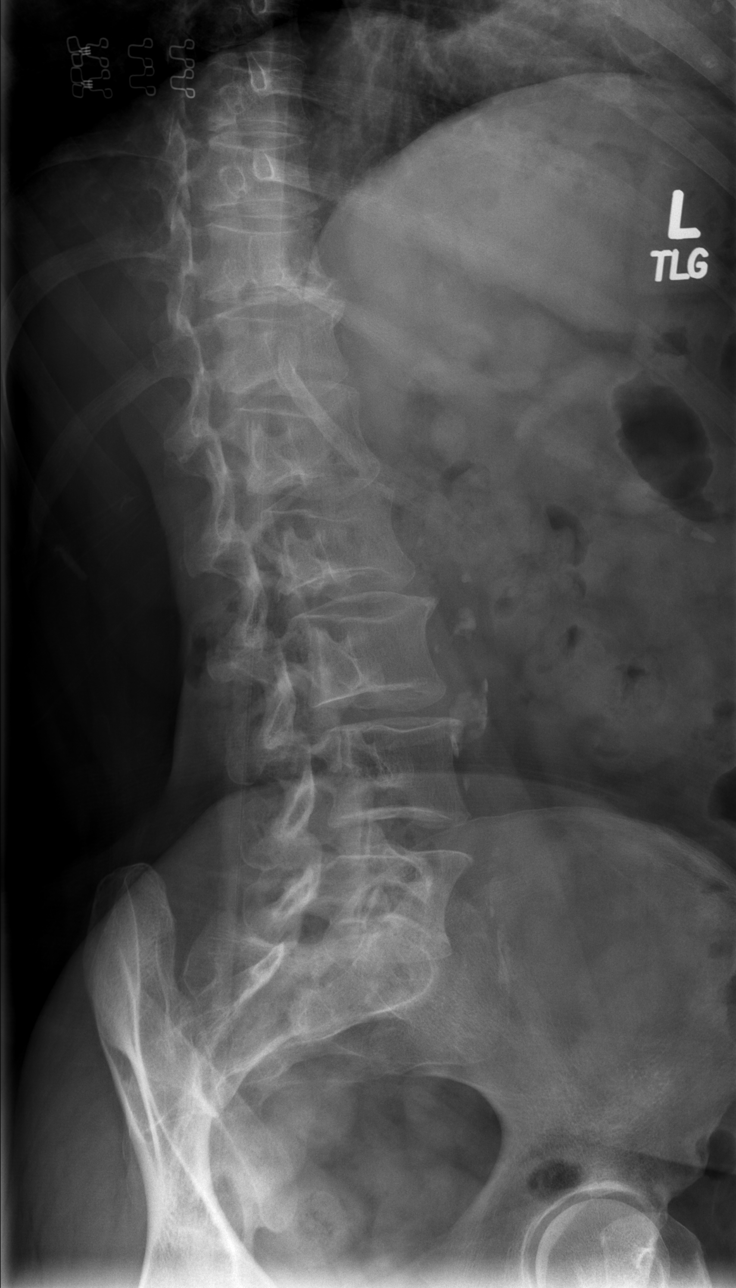

[t lumbar spine lat]
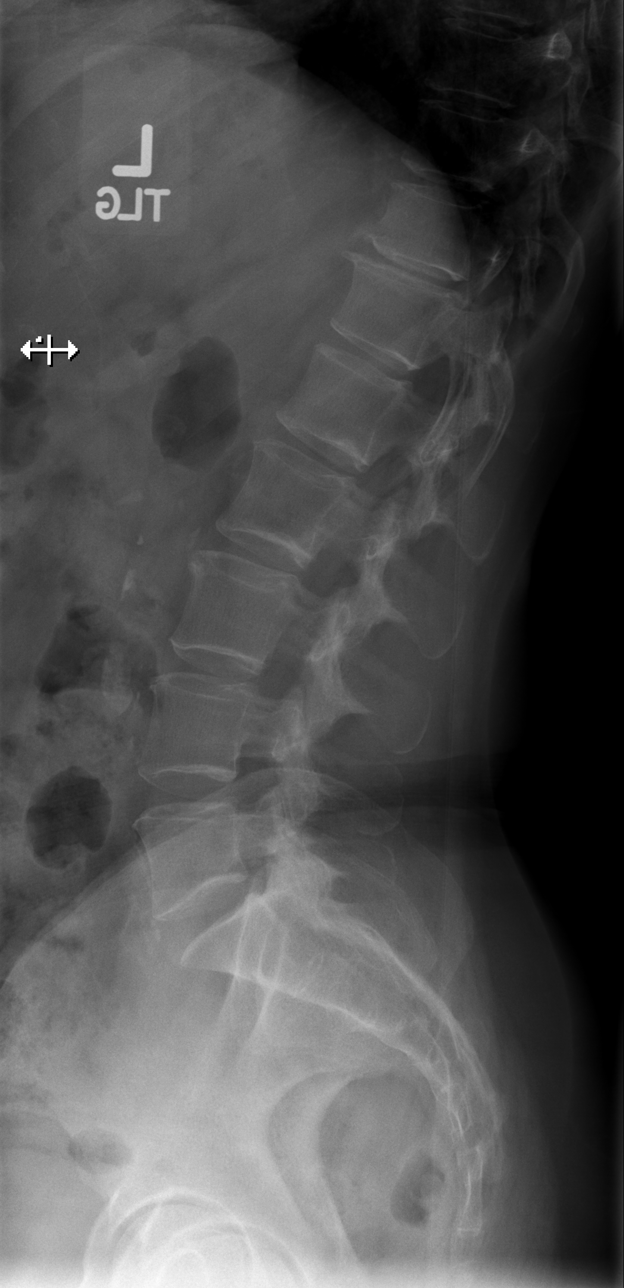

[t lumbar l-5 s-1 spot]
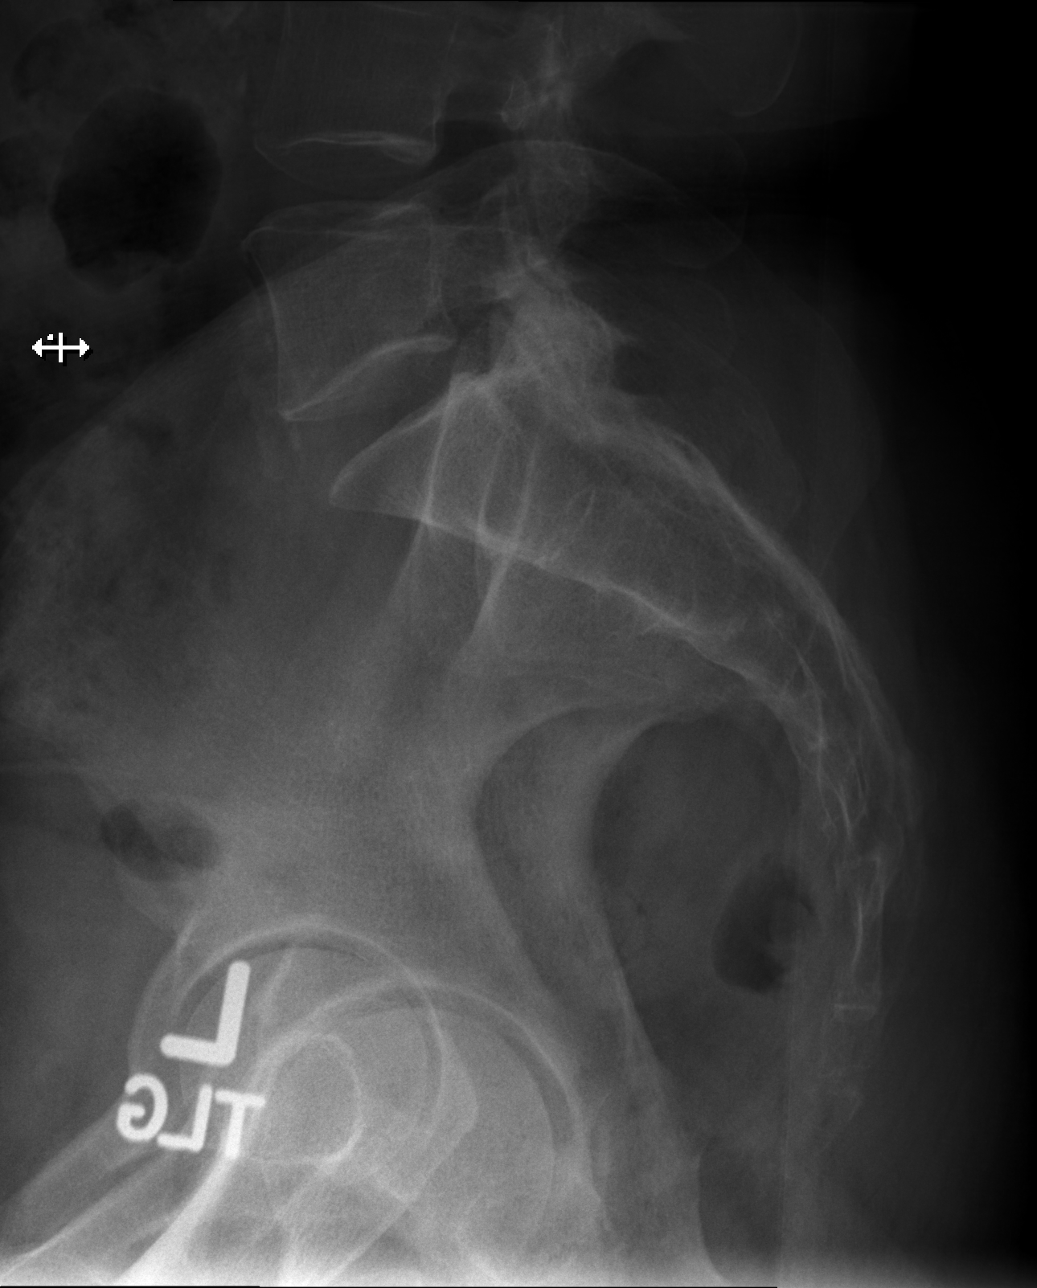

[5 of 5 positions shown; findings below may reference images not displayed]

FINDINGS: There are 5 nonrib bearing lumbar-type vertebral bodies.

The vertebral body heights are maintained.

The alignment is anatomic. There is no spondylolysis. There is no
static listhesis.

There is no acute fracture.

The disc spaces are maintained.

There is degenerative disc disease at T11-12. The remainder the disc
spaces are relatively well maintained.

There is abdominal aortic atherosclerosis.
IMPRESSION: No acute osseous injury of the lumbar spine.

## 2019-04-24 ENCOUNTER — Other Ambulatory Visit: Payer: Self-pay | Admitting: Family

## 2019-04-24 DIAGNOSIS — R928 Other abnormal and inconclusive findings on diagnostic imaging of breast: Secondary | ICD-10-CM

## 2019-05-03 ENCOUNTER — Ambulatory Visit
Admission: RE | Admit: 2019-05-03 | Discharge: 2019-05-03 | Disposition: A | Discharge status: Home / Self Care | Payer: BLUE CROSS/BLUE SHIELD | Source: Ambulatory Visit | Attending: Family | Admitting: Family

## 2019-05-03 ENCOUNTER — Other Ambulatory Visit: Payer: Self-pay

## 2019-05-03 ENCOUNTER — Ambulatory Visit
Admission: RE | Admit: 2019-05-03 | Discharge: 2019-05-03 | Disposition: A | Discharge status: Home / Self Care | Payer: BC Managed Care – PPO | Source: Ambulatory Visit | Attending: Family | Admitting: Family

## 2019-05-03 DIAGNOSIS — R928 Other abnormal and inconclusive findings on diagnostic imaging of breast: Secondary | ICD-10-CM

## 2020-04-17 ENCOUNTER — Ambulatory Visit: Payer: BC Managed Care – PPO | Admitting: Podiatry

## 2020-05-03 ENCOUNTER — Ambulatory Visit
Admission: EM | Admit: 2020-05-03 | Discharge: 2020-05-03 | Disposition: A | Discharge status: Home / Self Care | Payer: BC Managed Care – PPO

## 2020-05-03 ENCOUNTER — Other Ambulatory Visit: Payer: Self-pay

## 2020-05-03 DIAGNOSIS — M436 Torticollis: Secondary | ICD-10-CM

## 2020-05-03 DIAGNOSIS — R42 Dizziness and giddiness: Secondary | ICD-10-CM | POA: Diagnosis not present

## 2020-05-03 MED ORDER — CYCLOBENZAPRINE HCL 5 MG PO TABS: 5.0000 mg | ORAL_TABLET | Freq: Three times a day (TID) | ORAL | 0 refills | Status: AC | PRN

## 2020-05-03 MED ORDER — MECLIZINE HCL 25 MG PO TABS: 25.0000 mg | ORAL_TABLET | Freq: Three times a day (TID) | ORAL | 0 refills | Status: AC | PRN

## 2020-05-03 NOTE — ED Triage Notes (Addendum)
Pt states had a headache last night and woke up with dizziness. Denies headache today. States hx of dizziness if she misses her antidepressants, states took it last night and this morning. Denies nausea. Pt c/o neck stiffness.

## 2020-05-05 NOTE — ED Provider Notes (Signed)
[facility]    CSN: [id number] Arrival date & time: 05/03/20  1837      History   Chief Complaint Chief Complaint  Patient presents with  . Dizziness    HPI Marisa Cardenas is a 60 y.o. female.   Patient presenting today with several week history of neck stiffness bilaterally for which she has been working with a Land on this.  She states that she woke up last night with a tension headache bilateral temples and dizziness when moving her head around.  States the dizziness feels like the room is spinning around her and she is off balance.  The headache has gone away today but the dizziness has continued anytime she moves.  If she lays still on her back she states the dizziness is resolved.  She denies associated nausea, vomiting, visual changes, extremity numbness tingling or weakness, mental status changes, fever, speech or swallowing changes, recent head injury.  So far has not been taking anything over-the-counter for symptoms.  No past history of vertigo, states only time she ever seems to get dizzy is if she misses her antidepressant dosing but she has not done that in the recent past.    Past Medical History:  Diagnosis Date  . GERD (gastroesophageal reflux disease)   . Hypercholesteremia   . Neuropathy     There are no problems to display for this patient.   Past Surgical History:  Procedure Laterality Date  . NO PAST SURGERIES      OB History   No obstetric history on file.      Home Medications    Prior to Admission medications   Medication Sig Start Date End Date Taking? Authorizing Provider  cyclobenzaprine (FLEXERIL) 5 MG tablet Take 1 tablet (5 mg total) by mouth 3 (three) times daily as needed for muscle spasms. Do not drink alcohol or drive while taking this medication 05/03/20  Yes Particia Nearing, PA-C  DULoxetine (CYMBALTA) 60 MG capsule Take 60 mg by mouth daily.   Yes [provider]  lisinopril (ZESTRIL) 5 MG  tablet Take 5 mg by mouth daily.   Yes [provider]  meclizine (ANTIVERT) 25 MG tablet Take 1 tablet (25 mg total) by mouth 3 (three) times daily as needed for dizziness. 05/03/20  Yes Particia Nearing, PA-C  meloxicam (MOBIC) 15 MG tablet Take 15 mg by mouth daily.   Yes [provider]  omeprazole (PRILOSEC) 20 MG capsule Take 20 mg by mouth daily.   Yes [provider]  rosuvastatin (CRESTOR) 40 MG tablet Take 40 mg by mouth daily.   Yes [provider]    Family History Family History  Problem Relation Age of Onset  . COPD Mother        Living  . Lung cancer Father        Deceased  . Hypertension Brother   . Hyperlipidemia Brother   . Healthy Sister     Social History Social History   Tobacco Use  . Smoking status: Current Every Day Smoker    Packs/day: 1.50    Years: 20.00    Pack years: 30.00    Types: Cigarettes  . Smokeless tobacco: Never Used  Substance Use Topics  . Alcohol use: No  . Drug use: No     Allergies   Penicillins   Review of Systems Review of Systems Per HPI  Physical Exam Triage Vital Signs ED Triage Vitals  Enc Vitals Group  BP 05/03/20 1937 134/86     Pulse Rate 05/03/20 1937 91     Resp 05/03/20 1937 18     Temp 05/03/20 1937 98.2 F (36.8 C)     Temp Source 05/03/20 1937 Oral     SpO2 05/03/20 1937 95 %     Weight --      Height --      Head Circumference --      Peak Flow --      Pain Score 05/03/20 1938 0     Pain Loc --      Pain Edu? --      Excl. in GC? --    No data found.  Updated Vital Signs BP 134/86 (BP Location: Left Arm)   Pulse 91   Temp 98.2 F (36.8 C) (Oral)   Resp 18   SpO2 95%   Visual Acuity Right Eye Distance:   Left Eye Distance:   Bilateral Distance:    Right Eye Near:   Left Eye Near:    Bilateral Near:     Physical Exam Vitals and nursing note reviewed.  Constitutional:      Appearance: Normal appearance. She is not ill-appearing.   HENT:     Head: Atraumatic.     Nose: Nose normal.     Mouth/Throat:     Mouth: Mucous membranes are moist.     Pharynx: Oropharynx is clear.  Eyes:     Extraocular Movements: Extraocular movements intact.     Conjunctiva/sclera: Conjunctivae normal.     Pupils: Pupils are equal, round, and reactive to light.  Cardiovascular:     Rate and Rhythm: Normal rate and regular rhythm.     Heart sounds: Normal heart sounds.  Pulmonary:     Effort: Pulmonary effort is normal.     Breath sounds: Normal breath sounds.  Abdominal:     General: Bowel sounds are normal. There is no distension.     Palpations: Abdomen is soft.     Tenderness: There is no abdominal tenderness. There is no guarding.  Musculoskeletal:        General: Tenderness present. No swelling or deformity. Normal range of motion.     Cervical back: Normal range of motion and neck supple.     Comments: Bilateral cervical paraspinal muscles tender to palpation extending to SCM and trapezius.  Good range of motion at C-spine.  Skin:    General: Skin is warm and dry.     Findings: No erythema or rash.  Neurological:     General: No focal deficit present.     Mental Status: She is alert and oriented to person, place, and time. Mental status is at baseline.     Cranial Nerves: No cranial nerve deficit.     Sensory: No sensory deficit.     Motor: No weakness.     Coordination: Coordination normal.     Gait: Gait normal.  Psychiatric:        Mood and Affect: Mood normal.        Thought Content: Thought content normal.        Judgment: Judgment normal.      UC Treatments / Results  Labs (all labs ordered are listed, but only abnormal results are displayed) Labs Reviewed - No data to display  EKG   Radiology No results found.  Procedures Procedures (including critical care time)  Medications Ordered in UC Medications - No data to display  Initial Impression / Assessment and  Plan / UC Course  I have reviewed  the triage vital signs and the nursing notes.  Pertinent labs & imaging results that were available during my care of the patient were reviewed by me and considered in my medical decision making (see chart for details).     Vitals and exam very reassuring today.  No acute neurologic deficits noted on exam and she is alert, oriented and very well-appearing today.  She does become dizzy when sitting up from a reclining position during the interview and symptoms do resolve when she lays back down.  Symptoms consistent with vertigo and neck strain/stiffness which she is already working with chiropractor on.  Offered EKG, blood work, orthostatic vital signs and she declines these, opting for conservative management with meclizine to rule out vertigo.  Epley maneuvers reviewed, close PCP follow-up for recheck in the next week.  Go to the ED for any acutely worsening symptoms.  Reviewed red flag symptoms with patient.  Final Clinical Impressions(s) / UC Diagnoses   Final diagnoses:  Dizziness  Neck stiffness   Discharge Instructions   None    ED Prescriptions    Medication Sig Dispense Auth. Provider   cyclobenzaprine (FLEXERIL) 5 MG tablet Take 1 tablet (5 mg total) by mouth 3 (three) times daily as needed for muscle spasms. Do not drink alcohol or drive while taking this medication 15 tablet Particia Nearing, PA-C   meclizine (ANTIVERT) 25 MG tablet Take 1 tablet (25 mg total) by mouth 3 (three) times daily as needed for dizziness. 15 tablet Particia Nearing, [place]     PDMP not reviewed this encounter.   Roosvelt Maser [place], [place] 05/05/20 [phone]

## 2021-11-06 ENCOUNTER — Other Ambulatory Visit: Payer: Self-pay | Admitting: Student

## 2021-11-06 DIAGNOSIS — Z1231 Encounter for screening mammogram for malignant neoplasm of breast: Secondary | ICD-10-CM

## 2022-09-26 ENCOUNTER — Ambulatory Visit
Admission: EM | Admit: 2022-09-26 | Discharge: 2022-09-26 | Disposition: A | Discharge status: Home / Self Care | Payer: BC Managed Care – PPO | Attending: Physician Assistant | Admitting: Physician Assistant

## 2022-09-26 ENCOUNTER — Telehealth: Payer: Self-pay | Admitting: Physician Assistant

## 2022-09-26 DIAGNOSIS — F17210 Nicotine dependence, cigarettes, uncomplicated: Secondary | ICD-10-CM | POA: Insufficient documentation

## 2022-09-26 DIAGNOSIS — U071 COVID-19: Secondary | ICD-10-CM | POA: Insufficient documentation

## 2022-09-26 DIAGNOSIS — J069 Acute upper respiratory infection, unspecified: Secondary | ICD-10-CM | POA: Diagnosis present

## 2022-09-26 MED ORDER — ACETAMINOPHEN 325 MG PO TABS
650.0000 mg | ORAL_TABLET | Freq: Once | ORAL | Status: AC
  Administered 2022-09-26: 650 mg via ORAL

## 2022-09-26 NOTE — ED Triage Notes (Signed)
"  Started yesterday morning with a tickle in my throat, then started coughing all day, last night during night chills, ha, dry cough now and these things kept me up last night". No fever known. No sob. No new/unexplained rash. No COVID19 testing at home (requested today).

## 2022-09-26 NOTE — Telephone Encounter (Signed)
Covid test order.

## 2022-09-27 LAB — SARS CORONAVIRUS 2 (TAT 6-24 HRS): SARS Coronavirus 2: POSITIVE — AB

## 2022-10-01 NOTE — ED Provider Notes (Signed)
[facility]    CSN: [id number] Arrival date & time: 09/26/22  6578      History   Chief Complaint Chief Complaint  Patient presents with   Cough    HPI Marisa Cardenas is a 62 y.o. female.   Patient here today for evaluation of itchy throat, chills, headache, dry cough.  Symptoms started yesterday.  She has not any fever.  She denies any shortness of breath.  She does have fever in office today.  She does not report treatment for symptoms.  The history is provided by the patient.  Cough Associated symptoms: chills, fever, headaches and sore throat   Associated symptoms: no ear pain, no eye discharge, no shortness of breath and no wheezing     Past Medical History:  Diagnosis Date   GERD (gastroesophageal reflux disease)    Hypercholesteremia    Neuropathy     There are no problems to display for this patient.   Past Surgical History:  Procedure Laterality Date   NO PAST SURGERIES      OB History   No obstetric history on file.      Home Medications    Prior to Admission medications   Medication Sig Start Date End Date Taking? Authorizing Provider  DULoxetine (CYMBALTA) 60 MG capsule Take 60 mg by mouth daily.   Yes [provider]  ezetimibe (ZETIA) 10 MG tablet Take 10 mg by mouth daily. 06/27/22  Yes [provider]  ibuprofen (ADVIL) 200 MG tablet Take 200 mg by mouth every 6 (six) hours as needed for fever or mild pain. Last dose: 5am.   Yes [provider]  methocarbamol (ROBAXIN) 500 MG tablet Take 500 mg by mouth 2 (two) times daily. 07/03/22  Yes [provider]  rosuvastatin (CRESTOR) 40 MG tablet Take 40 mg by mouth daily.   Yes [provider]  cyclobenzaprine (FLEXERIL) 5 MG tablet Take 1 tablet (5 mg total) by mouth 3 (three) times daily as needed for muscle spasms. Do not drink alcohol or drive while taking this medication 05/03/20   Particia Nearing, PA-C  lisinopril (ZESTRIL) 5 MG  tablet Take 5 mg by mouth daily.    [provider]  meclizine (ANTIVERT) 25 MG tablet Take 1 tablet (25 mg total) by mouth 3 (three) times daily as needed for dizziness. 05/03/20   Particia Nearing, PA-C  meloxicam (MOBIC) 15 MG tablet Take 15 mg by mouth daily.    [provider]  omeprazole (PRILOSEC) 20 MG capsule Take 20 mg by mouth daily.    [provider]    Family History Family History  Problem Relation Age of Onset   COPD Mother        Living   Lung cancer Father        Deceased   Hypertension Brother    Hyperlipidemia Brother    Healthy Sister     Social History Social History   Tobacco Use   Smoking status: Every Day    Current packs/day: 1.50    Average packs/day: 1.5 packs/day for 20.0 years (30.0 ttl pk-yrs)    Types: Cigarettes   Smokeless tobacco: Never  Vaping Use   Vaping status: Never Used  Substance Use Topics   Alcohol use: No   Drug use: No     Allergies   Penicillins   Review of Systems Review of Systems  Constitutional:  Positive for chills and fever.  HENT:  Positive for congestion and  sore throat. Negative for ear pain.   Eyes:  Negative for discharge and redness.  Respiratory:  Positive for cough. Negative for shortness of breath and wheezing.   Gastrointestinal:  Negative for abdominal pain, diarrhea, nausea and vomiting.  Neurological:  Positive for headaches.     Physical Exam Triage Vital Signs ED Triage Vitals  Encounter Vitals Group     BP 09/26/22 1003 131/81     Systolic BP Percentile --      Diastolic BP Percentile --      Pulse Rate 09/26/22 1003 (!) 102     Resp 09/26/22 1003 18     Temp 09/26/22 1003 (!) 100.6 F (38.1 C)     Temp Source 09/26/22 1003 Oral     SpO2 09/26/22 1003 99 %     Weight 09/26/22 1001 131 lb (59.4 kg)     Height 09/26/22 1001 5\' 4"  (1.626 m)     Head Circumference --      Peak Flow --      Pain Score 09/26/22 0959 9     Pain Loc --      Pain Education  --      Exclude from Growth Chart --    No data found.  Updated Vital Signs BP 131/81 (BP Location: Left Arm)   Pulse (!) 102   Temp (!) 100.6 F (38.1 C) (Oral)   Resp 18   Ht 5\' 4"  (1.626 m)   Wt 131 lb (59.4 kg)   SpO2 99%   BMI 22.49 kg/m       Physical Exam Vitals and nursing note reviewed.  Constitutional:      General: She is not in acute distress.    Appearance: Normal appearance. She is not ill-appearing.  HENT:     Head: Normocephalic and atraumatic.     Nose: Congestion present.     Mouth/Throat:     Mouth: Mucous membranes are moist.     Pharynx: No oropharyngeal exudate or posterior oropharyngeal erythema.  Eyes:     Conjunctiva/sclera: Conjunctivae normal.  Cardiovascular:     Rate and Rhythm: Normal rate and regular rhythm.     Heart sounds: Normal heart sounds. No murmur heard. Pulmonary:     Effort: Pulmonary effort is normal. No respiratory distress.     Breath sounds: Normal breath sounds. No wheezing, rhonchi or rales.  Skin:    General: Skin is warm and dry.  Neurological:     Mental Status: She is alert.  Psychiatric:        Mood and Affect: Mood normal.        Thought Content: Thought content normal.      UC Treatments / Results  Labs (all labs ordered are listed, but only abnormal results are displayed) Labs Reviewed - No data to display  EKG   Radiology No results found.  Procedures Procedures (including critical care time)  Medications Ordered in UC Medications  acetaminophen (TYLENOL) tablet 650 mg (650 mg Oral Given 09/26/22 1006)    Initial Impression / Assessment and Plan / UC Course  I have reviewed the triage vital signs and the nursing notes.  Pertinent labs & imaging results that were available during my care of the patient were reviewed by me and considered in my medical decision making (see chart for details).    Suspect likely viral etiology of symptoms and will screen for COVID.  Recommended symptomatic  treatment at home, increase fluids and rest with follow-up if  no gradual improvement with any further concerns.  Final Clinical Impressions(s) / UC Diagnoses   Final diagnoses:  Acute upper respiratory infection   Discharge Instructions   None    ED Prescriptions   None    PDMP not reviewed this encounter.   Tomi Bamberger, PA-C 10/01/22 2046

## 2023-07-30 ENCOUNTER — Ambulatory Visit
Admission: RE | Admit: 2023-07-30 | Discharge: 2023-07-30 | Disposition: A | Discharge status: Home / Self Care | Source: Ambulatory Visit | Attending: Student | Admitting: Student

## 2023-07-30 ENCOUNTER — Other Ambulatory Visit: Payer: Self-pay | Admitting: Student

## 2023-07-30 DIAGNOSIS — Z1231 Encounter for screening mammogram for malignant neoplasm of breast: Secondary | ICD-10-CM
